# Patient Record
Sex: Female | Born: 1985 | Race: White | Hispanic: No | Marital: Married | Smoking: Former smoker
Health system: Southern US, Community
[De-identification: ages and names within clinical notes are randomized; demographics above are authoritative.]

## PROBLEM LIST (undated history)

## (undated) DIAGNOSIS — E079 Disorder of thyroid, unspecified: Secondary | ICD-10-CM

## (undated) DIAGNOSIS — N1330 Unspecified hydronephrosis: Secondary | ICD-10-CM

## (undated) DIAGNOSIS — N200 Calculus of kidney: Secondary | ICD-10-CM

## (undated) HISTORY — PX: TONSILLECTOMY: SUR1361

## (undated) HISTORY — PX: SEPTOPLASTY: SUR1290

## (undated) HISTORY — PX: NEPHROSTOMY: SHX1014

## (undated) HISTORY — PX: URETHERAL RE-IMPLANTATION: SHX2616

## (undated) HISTORY — PX: BREAST SURGERY: SHX581

## (undated) HISTORY — PX: FRACTURE SURGERY: SHX138

## (undated) HISTORY — PX: ABDOMINAL HYSTERECTOMY: SHX81

---
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
## 2003-07-06 ENCOUNTER — Other Ambulatory Visit: Admission: RE | Admit: 2003-07-06 | Discharge: 2003-07-06 | Payer: Self-pay | Admitting: Family Medicine

## 2004-05-09 ENCOUNTER — Other Ambulatory Visit: Admission: RE | Admit: 2004-05-09 | Discharge: 2004-05-09 | Payer: Self-pay | Admitting: Family Medicine

## 2004-05-25 ENCOUNTER — Ambulatory Visit (HOSPITAL_COMMUNITY): Admission: RE | Admit: 2004-05-25 | Discharge: 2004-05-25 | Payer: Self-pay | Admitting: Urology

## 2004-05-29 ENCOUNTER — Inpatient Hospital Stay (HOSPITAL_COMMUNITY): Admission: AD | Admit: 2004-05-29 | Discharge: 2004-05-29 | Payer: Self-pay | Admitting: Obstetrics & Gynecology

## 2004-05-30 ENCOUNTER — Inpatient Hospital Stay (HOSPITAL_COMMUNITY): Admission: AD | Admit: 2004-05-30 | Discharge: 2004-05-31 | Payer: Self-pay | Admitting: Obstetrics & Gynecology

## 2004-06-20 ENCOUNTER — Ambulatory Visit: Payer: Self-pay | Admitting: Obstetrics and Gynecology

## 2005-01-24 ENCOUNTER — Ambulatory Visit (HOSPITAL_COMMUNITY)
Admission: RE | Admit: 2005-01-24 | Discharge: 2005-01-24 | Payer: Self-pay | Admitting: Orthodontics and Dentofacial Orthopedics

## 2005-08-28 ENCOUNTER — Encounter (INDEPENDENT_AMBULATORY_CARE_PROVIDER_SITE_OTHER): Payer: Self-pay | Admitting: *Deleted

## 2005-08-28 ENCOUNTER — Ambulatory Visit (HOSPITAL_COMMUNITY): Admission: RE | Admit: 2005-08-28 | Discharge: 2005-08-28 | Payer: Self-pay | Admitting: Obstetrics and Gynecology

## 2006-05-09 ENCOUNTER — Inpatient Hospital Stay (HOSPITAL_COMMUNITY): Admission: RE | Admit: 2006-05-09 | Discharge: 2006-05-10 | Payer: Self-pay | Admitting: Obstetrics and Gynecology

## 2006-05-09 ENCOUNTER — Encounter (INDEPENDENT_AMBULATORY_CARE_PROVIDER_SITE_OTHER): Payer: Self-pay | Admitting: *Deleted

## 2006-05-26 ENCOUNTER — Inpatient Hospital Stay (HOSPITAL_COMMUNITY): Admission: AD | Admit: 2006-05-26 | Discharge: 2006-05-26 | Payer: Self-pay | Admitting: Obstetrics and Gynecology

## 2009-05-27 ENCOUNTER — Emergency Department (HOSPITAL_COMMUNITY): Admission: EM | Admit: 2009-05-27 | Discharge: 2009-05-28 | Payer: Self-pay | Admitting: Emergency Medicine

## 2009-11-20 ENCOUNTER — Emergency Department (HOSPITAL_COMMUNITY): Admission: EM | Admit: 2009-11-20 | Discharge: 2009-11-20 | Payer: Self-pay | Admitting: Emergency Medicine

## 2010-05-11 LAB — URINALYSIS, ROUTINE W REFLEX MICROSCOPIC
Leukocytes, UA: NEGATIVE
Nitrite: NEGATIVE
Specific Gravity, Urine: 1.027 (ref 1.005–1.030)
Urobilinogen, UA: 0.2 mg/dL (ref 0.0–1.0)

## 2010-05-11 LAB — URINE MICROSCOPIC-ADD ON

## 2010-05-11 LAB — URINE CULTURE: Culture  Setup Time: 201109260016

## 2010-05-11 LAB — POCT I-STAT, CHEM 8
HCT: 42 % (ref 36.0–46.0)
Hemoglobin: 14.3 g/dL (ref 12.0–15.0)
Potassium: 4 mEq/L (ref 3.5–5.1)
Sodium: 140 mEq/L (ref 135–145)
TCO2: 25 mmol/L (ref 0–100)

## 2010-05-17 LAB — URINALYSIS, ROUTINE W REFLEX MICROSCOPIC
Nitrite: POSITIVE — AB
Protein, ur: NEGATIVE mg/dL
Specific Gravity, Urine: 1.023 (ref 1.005–1.030)
Urobilinogen, UA: 1 mg/dL (ref 0.0–1.0)

## 2010-05-17 LAB — DIFFERENTIAL
Eosinophils Absolute: 0 10*3/uL (ref 0.0–0.7)
Eosinophils Relative: 0 % (ref 0–5)
Lymphocytes Relative: 9 % — ABNORMAL LOW (ref 12–46)
Lymphs Abs: 1.1 10*3/uL (ref 0.7–4.0)
Monocytes Absolute: 0.8 10*3/uL (ref 0.1–1.0)

## 2010-05-17 LAB — BASIC METABOLIC PANEL
BUN: 13 mg/dL (ref 6–23)
Chloride: 104 mEq/L (ref 96–112)
GFR calc non Af Amer: >60 mL/min (ref >60)
Glucose, Bld: 105 mg/dL — ABNORMAL HIGH (ref 70–99)
Potassium: 3.3 mEq/L — ABNORMAL LOW (ref 3.5–5.1)
Sodium: 137 mEq/L (ref 135–145)

## 2010-05-17 LAB — CBC
HCT: 39.8 % (ref 36.0–46.0)
Hemoglobin: 13.7 g/dL (ref 12.0–15.0)
MCV: 92.4 fL (ref 78.0–100.0)
Platelets: 210 10*3/uL (ref 150–400)
RDW: 12.7 % (ref 11.5–15.5)
WBC: 11.8 10*3/uL — ABNORMAL HIGH (ref 4.0–10.5)

## 2010-05-17 LAB — URINE MICROSCOPIC-ADD ON

## 2010-07-14 NOTE — Group Therapy Note (Signed)
NAME:  Savannah Reyes, Savannah Reyes NO.:  [id number]   MEDICAL RECORD NO.:  [id number]          PATIENT TYPE:  WOC   LOCATION:  [facility]                   FACILITY:  [facility]   PHYSICIAN:  Argentina Donovan, MD        DATE OF BIRTH:  May 08, 1985   DATE OF SERVICE:  06/20/2004                                    CLINIC NOTE   The patient is a 25 year old white female nulligravida who underwent  menarche fairly late at age 11 and who was fine until approximately six  months ago when she saw her primary care doctor and he put her on Synthroid.  That was done because her periods became irregular.  She became regular for  two months and then started having abnormal bleeding.  He tried her on  Lo/Ovral and Yasmin with the same result.  She had been placed on Synthroid  before for hypothyroidism, and then she said her tests were normal after she  had stopped it, so at the present time she is not on any.  Her problem is  now that she has been bleeding for the past two months, which she states is  heavy; however, she has a hematocrit of 35, so the bleeding is probably less  than her impression of it.  In any case on physical examination she weighs  176 pounds and is 5 foot 2, normal blood pressure at 127/73.  HEENT:  Significant by mild-to-moderate acne and upper lip hair growth, as well as  some chin growth that she shaves.  She is chunky in appearance and has a  female escutcheon, which she shaves.  External genitalia are normal, not an  enlarged clitoris, BUS within normal limits, vagina is clean and well  rugated, cervix clean and nulliparous, uterus and adnexae are normal.  The  patient had a recent ultrasound which was normal.  We discussed in detail  polycystic ovarian syndrome, as well as insulin insensitivity.  I told her  our plan with her is to place her on a strong birth control, i.e., Ovcon 50  to control the bleeding.  If that does not control the bleeding, then I  would do a D&C and  hysteroscopy.  If it does control it, then we may reduce  the hormone levels of the pills over the next few months until we find one  that works and yet is the lowest that she can take.  She is not on Synthroid  at this time, so we are going to get a TSH on her, also going to get a  hormone evaluation as well as a hemoglobin A1c.  Her father is a diabetic.   IMPRESSION:  Polycystic ovarian syndrome.  The Rx is for Ovcon 50.      PR/MEDQ  D:  06/20/2004  T:  06/20/2004  Job:  [id number]
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
## 2010-07-14 NOTE — Discharge Summary (Signed)
[place]. [facility]  Patient:    Savannah Reyes, Savannah Reyes                    MRN: [id number] Adm. Date:  [id number] Disc. Date: [id number] Attending:  Dominica Severin Dictator:   Ralene Bathe, P.A.                           Discharge Summary  ADMISSION DIAGNOSES: 1. Nonunion of right tibia, status post Rush rodding of right fibula. 2. History of renal calculi.  DISCHARGE DIAGNOSES: 1. Nonunion of right tibia, status post Rush rodding of right fibula. 2. History of renal calculi. 3. Status post removal of Rush rod and intermedullary nailing of right tibia.  OPERATIONS:  Intermedullary nail to right tibia, removal of Rush rod of right fibula, and ostectomy.  Surgeon:  Elisha Ponder, M.D.  Assistant:  Dorie Rank, P.A.  General anesthesia.  BRIEF HISTORY:  Savannah Reyes is a 25 year old white female who has been involved in our practice for some time following a right tib-fib fracture. She was treated with Rush rod to the right fibula and closed casting.  She has developed a nonunion.  Despite conservative measures, this has not improved and removal of Rush rod and IM nailing are indicated.  The risks and benefits were discussed with her and her mother.  At this time, she wishes to proceed.  HOSPITAL COURSE:  The patient was admitted and underwent the above-noted procedures, and tolerated this well.  All appropriate IV antibiotics and analgesics were provided.  Postoperatively, the patient was placed full weightbearing in therapy.  She had an extreme amount of pain.  PCA morphine was continued through postoperative day #2.  The Hemovac that was placed intraoperatively was removed without difficulty.  The patient remained afebrile.  She had some mild nausea and vomiting that resolved.  On May 23, 2000, she was afebrile, the vital signs were stable, she was voiding without difficulty, she not longer had nausea or vomiting, and she was tolerating  p.o. analgesics.  At that time, her dressings were changed and she was stable for discharge to home in the care of her mother.  All incisions were dry with no evidence of infection, no erythema, and the calf was soft.  LABORATORY VALUES:  Preoperative hemoglobin 3.5, postoperatively of 10.2 and 9.6.  Chemistries were normal on admission.  X-rays were not done.  CONDITION ON DISCHARGE:  Stable and improved.  DISPOSITION:  The patient is being discharged to home.  DISCHARGE MEDICATIONS:  Prescriptions were given for Percocet 5 mg, #40, one to two p.r.n. pain and Robaxin 500 mg one every eight hours p.r.n. spasm.  One aspirin b.i.d.  She is also on Colace for constipation.  Continue her over-the-counter calcium supplements.  Resume home medications.  SPECIAL INSTRUCTIONS:  She is to continue calf pumps and weightbearing as tolerated.  Crutches for comfort.  FOLLOW-UP:  Follow up in our office two weeks postoperatively.  Call for a time.  DIET:  Resume home diet. DD:  06/05/00 TD:  06/05/00 Job: [id number]

## 2010-07-14 NOTE — Op Note (Signed)
[place]. [facility]  Patient:    Savannah Reyes, Savannah Reyes                    MRN: [id number] Adm. Date:  [id number] Attending:  Dominica Severin                           Operative Report  DATE OF BIRTH:  Nov 03, 1985.  PREOPERATIVE DIAGNOSIS:  Status post Rush rodding, right fibula, with tibial nonunion.  POSTOPERATIVE DIAGNOSIS:  Status post Rush rodding, right fibula, with tibial nonunion.  PROCEDURES: 1. Removal of Rush rod, right fibula. 2. Fibular ostectomy 1 cm, approximately 13 cm above the distal fibula tip. 3. Reamed intramedullary tibial rod placement, right tibia, with additional    interlocking.  SURGEON:  Elisha Ponder, M.D.  ASSISTANT:  Dorie Rank, P.A.  COMPLICATIONS:  None.  ANESTHESIA:  General endotracheal.  ESTIMATED BLOOD LOSS:  Less than 200 cc.  DRAINS:  Two.  TOURNIQUET TIME:  Zero.  INDICATIONS FOR PROCEDURE:  This patient is a 25 year old white female who presents with the above-mentioned diagnosis.  I have counseled her and her parents in regard to the risks and benefits of surgery, including risk of infection, bleeding, damage to normal structures, and failure of surgery to accomplish the intended goals of relieving symptoms and restoring function.  I have discussed with them the risks of fat embolus, pulmonary embolus, and other postoperative complications.  With this in mind, they desire to proceed. All questions have been encouraged and answered preoperatively.  OPERATIVE FINDINGS:  The patient had tibial nonunion.  She underwent successful reaming and tibial rod placement.  This was a [employer] nail, titanium in nature, 8.5 mm wide and 30 cm long.  It was locked distally and left unlocked proximally.  Fibular ostectomy and Rush rod removal were accomplished without difficulty.  There were no complications.  DESCRIPTION OF PROCEDURE:  The patient was seen by myself and anesthesia, taken to the  operative suite, underwent smooth induction of general endotracheal anesthesia, was laid supine, appropriately padded, prepped and draped in the usual sterile fashion about the right lower extremity after tourniquet was applied.  Once this was done, the patient had the leg placed in a triangle, and the operation commenced with an incision about the anterior medial aspect of the knee proximal tibia region.  The patient had dissection carried through skin with a knife blade.  Following this, electrocautery was used for hemostasis, and deeper dissection was carried out.  I entered the anterior tibial cortex with an awl under radiographic guidance just underneath the patellar tendon.  Care was taken to avoid injury to the patellar tendon. This was in the bare spot and appropriately noted on x-ray.  Following awl placement under AP and lateral x-ray, the patient then had guidewire placed. The guidewire was difficult to advance with the ball tip over the nonunion site, and thus I enlarged the proximal tract and then placed a 5 mm hand-held reamer into the previously-made hole, attached it across the fracture site with a hand-held twisting action, and that was enough to allow for a ball-tip guidewire placement across the fracture site of the 2.0 mm variety ball-tip guidewire.  Once this was done, patient underwent sequential reaming up to a 9.5, taking care to take x-rays in the AP and lateral plane to ensure correct placement and position.  I was very happy with the reaming.  There was excellent chatter.  Following this, the patient was reamed appropriately in the proximal tibial region.  The tibia was then measured without difficulty. Following this, the tibial nail was placed without difficulty.  The tibial nail had no complications with its placement.  It was centered nice distally on AP and lateral and corrected her recurvatum which was previously present and was then locked distally with  perfect circle technique utilizing [employer] interlocking screws.  This was done through stab incisions in the usual standard fashion.  Once this was done, the patient had the Rush removed through a small 1 cm incision made distally.  Dissection was then accomplished at the midportion of the leg laterally where the fibula was isolated.  Dissection was carried through skin with a knife blade.  An interval between the anterior and lateral compartments was made, a fasciotomy was performed so as to prevent painful muscle herniation, and the patient then had dissection carried down to the lateral aspect of the fibula.  I made sure this was 13 cm above the tip of the fibula and then performed an ostectomy of a 1 cm piece of the fibula bone using an oscillating saw and osteotome.  This was done without difficulty.  The 1 cm piece of fibula was removed.  Following this, the wound was copiously irrigated and then closed in layered fashion with 2-0 Vicryl in the subcutaneous tissue, drain in the deeper tissue, and staple gun in the skin edge.  There was no excessive bleeding, and this looked excellent.  Once this was done, final x-rays were taken and saved for permanent documentation.  This showed excellent alignment in the AP and lateral planes, good nail position proximally, distally, and across the fracture site, and a good fibular ostectomy, which will allow impaction of the tibial nonunion site.  Thus the tibial nonunion site underwent reaming, which should serve as a bone graft, and intramedullary rod placement.  Will begin immediate weightbearing, as she is effectively dynamized by not locking her proximally.  Distal screws had excellent fixation, and she should be okay for weightbearing to tolerance.  I did stress-test the ankle and noted no significant instability after the fibular ostectomy.  The syndesmosis was competent.  The patient had an eighth-inch Hemovac drain placed  proximally in the wound, and this was closed in a layered fashion with 2-0 Vicryl in the deep tissue, 2-0 Vicryl in the subcutaneous tissue, and staple gun in the skin  edge.  The medial stab incisions for the interlocking screws and the distal fibular tip were closed with interrupted Prolene suture.  The patient had very soft compartments, excellent refill, and no immediate complications.  I placed a sterile compressive dressing and Ace wraps around the leg.  She was taken to the recovery room, extubated, in stable condition.  All sponge, needle, and instrument counts were reported as correct, and there were no immediate intraoperative complications.  She will be admitted for IV fluids, observation, IV antibiotics, pain management, etc.  All questions have been encouraged and answered.  It has been a pleasure to participate in her care, and I will look forward to participating in her postoperative recovery. DD:  05/20/00 TD:  05/21/00 Job: [id number]
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
## 2010-07-14 NOTE — Op Note (Signed)
[facility]  Patient:    Savannah Reyes, Savannah Reyes                    MRN: [id number] Proc. Date: 06/22/99 Adm. Date:  [id number] Attending:  Dominica Reyes                           Operative Report  DATE OF BIRTH:  September 17, 1950  PREOPERATIVE DIAGNOSIS: Right closed tibia-fibula fracture, distal third, with displacement.  POSTOPERATIVE DIAGNOSIS:  Right closed tibia-fibula fracture, distal third, with displacement.  OPERATION:  Rush rod intramedullary nailing of the right fibula to restore anatomical alignment and closed reduction of the right tibia followed by long leg cast placement after reduction.  SURGEON:  Aron Baba, M.D.  ASSISTANT:  Ralene Bathe, P.A.  ANESTHESIA:  General.  IMMEDIATE COMPLICATIONS:  None.  ESTIMATED BLOOD LOSS:  Minimal.  TOURNIQUET TIME:  0  INDICATIONS:  This patient is a very pleasant 24 year old white female who is skeletally mature and sustained the above-mentioned injury today.  This happened this morning.  She has not had any neurovascular problems, signs or symptoms of compartment syndrome, or other abnormalities in her course to date.  The patient and her family have been counselled in regards to IM nailing of the tibia and fibula and/or fixation as necessary to gain anatomical alignment and restore the integrity to her lower extremity.  I discussed the risks and benefits of surgery including risk of infection, bleeding, anesthesia, damage to normal structures, failure of surgery to attain intended goals of relieving symptoms and restoring function.  With this in mind, they desire to proceed.  I have discussed with them pulmonary embolus, fat embolus, malunion, nonunion, and other complications.  They understand and desire to proceed.  OPERATIVE FINDINGS:  This patient had a displaced tibia-fibula fracture.  Once the patient underwent Rush rod placement about the fibula with closed reduction of  the tibia, the anatomic restoration in the AP and lateral planes was satisfactory.  She had a good mediolateral stability without any varus or valgus angulation.  She appeared to lock in nicely.  She had some slight anteroposterior movement; however, this was acceptable, I felt, given her age, very small canal diameter in the tibia, and anatomic restoration.  I then proceeded with long leg casting.  Long leg casting was performed.  Hard copy x-rays were taken, and following this, deemed this view to the adequate.  I have discussed this with the family at length.  They were shown the x-rays intraoperatively as well as the intraoperative decision making.  They were pleased with the outcome of surgery.  I did discuss with them that the patient may have a risk for displacement, etc.  Should she displace, she may ultimately require IM nailing in the tibia.  However, at the present time, she appears fairly stable, and, given her small canal diameter, I do think she is very acceptable to treatment in this fashion.  DESCRIPTION OF PROCEDURE IN DETAIL:  The patient was brought to the operative suite after cancelling and holding by myself in anesthesia.  Once in the operative suite, she underwent a smooth induction of anesthesia.  Dr. Laverle Hobby supervised this.  Next, she was laid supine and appropriately padded and prepped and draped about the right lower extremity with Betadine scrub followed by DuraPrep prep.  Sterile drapes were placed.  Following this, the patient had the leg placed  in slight hand-held traction, and a small 1 cm incision was made over the fibula.  With this incision made, the patient then had dissection carried bluntly down to the fibula tip.  The fibula tip was entered with soft tissue protector around a drill.  The drill entered the distal tip of the fibula.  Soft tissue was protected.  Following this, the Rush rod awl was used for making a path in the distal fibula.   Following this, a 2.5 Rush rod was then placed in a distal-to-proximal fashion.  The tibia-fibula fracture was reduced, and the Rush rod was then placed across the fibula fracture site.  I was very happy with the reduction at this point.  It was checked under AP, lateral, and oblique x-rays, and there was a good mediolateral stability.  There was slight AP movement which was noted on x-ray (fluoroscopy).  With this noted, I then took a hard copy film.  This looked to be fairly acceptable.  Given her age, injury, and alignment, I did feel that she would be acceptable under treatment at this juncture in a long leg cast. Given the small canal diameter, I elected to forego IM nailing of the tibia and place her in a long leg cast.  I then placed her in a long leg cast padded to my satisfaction and wrapped with fiberglass.  This was done without difficulty.  X-rays were taken in the cast which showed adequate reduction in the AP plane, but the lateral plane also showed good reduction.  There was no angulation.  There was slight translation noted.  This was felt to be acceptable by myself.  Once this was viewed, I then examined the leg once again and overwrapped it with fiberglass.  Following this, the patient was extubated and taken to the recovery room in stable condition.  In the recovery room, she could move her toes and feel her toes.  There were no signs or symptoms of compartment syndrome.  She was nontender with passive dorsiflexion.  We will monitor her cast very closely tonight for any signs or symptoms of compartment syndrome, cast tightness, or excessive swelling, etc. Her compartments were soft pre and postoperatively.  There was no excessive soft tissue trauma noted.  The patient had a full discussion about the surgery with her family.  I showed them x-rays.  They were very pleased with her alignment and the decision making, etc.  We will monitor her closely in the hospital.  All  questions have been encouraged and answered. DD:  06/22/99 TD:  06/23/99 Job: [id number]

## 2010-07-14 NOTE — Op Note (Signed)
NAME:  Savannah Reyes, Savannah Reyes             ACCOUNT NO.:  [id number]   MEDICAL RECORD NO.:  [id number]          PATIENT TYPE:  AMB   LOCATION:  SDC                           FACILITY:  [facility]   PHYSICIAN:  Michelle L. Grewal, M.D.DATE OF BIRTH:  06-26-1985   DATE OF PROCEDURE:  05/09/2006  DATE OF DISCHARGE:                               OPERATIVE REPORT   PREOPERATIVE DIAGNOSES:  1. Dysfunctional uterine bleeding.  2. Pelvic pain.  3. Polycystic ovary.   POSTOPERATIVE DIAGNOSES:  1. Dysfunctional uterine bleeding.  2. Pelvic pain.  3. Polycystic ovary.   PROCEDURE:  Total abdominal hysterectomy and bilateral salpingo-  oophorectomy.   SURGEON:  Michelle L. Vincente Poli, M.D.   ASSISTANTFreddy Finner, M.D.   ANESTHESIA:  Spinal.   SPECIMENS:  Uterus, tubes, cervix and ovaries.   ESTIMATED BLOOD LOSS:  200 mL.   COMPLICATIONS:  None.   PROCEDURE:  Patient was taken to the operating room after informed  consent was obtained.  She is a 25 year old gravida 0 with known PCO;  she also has had several years of DUB and pelvic pain.  This patient has  been well-known to me.  We have tried multiple birth control pills,  Glucophage and other insulin sensitizers to control her symptomatology.  One year ago, I performed a D&C for her DUB.  Unfortunately, none of the  measures have worked.  She has had pelvic pain almost every day since  the surgery.  She also complained of bleeding almost every day since the  surgery as well.  The patient, over a year ago, had requested  hysterectomy and oophorectomy for relief of her symptoms; I, however,  was reluctant to because she has never conceived and because of her age.  Throughout this past time, she has requested multiple times and I even  had her come in with her mother for extensive counseling.  After  extensive counseling in regards to hysterectomy, I agreed to proceed  with the surgery.  She is well aware of the known and acceptable risks  of  surgery.  First off, she is aware that she will no longer have  fertility after the surgery.  She is also aware of the risks associated  with surgery such as anesthesia, risk of infection, bleeding, risk of  injury to internal organs, risk of return of pelvic pain, risk of  problems associated with postsurgical hormone therapy.  After she was  taken to the operating room, a spinal was placed by Dr. Harvest Forest.  She was  prepped and draped in the usual sterile fashion.  A Foley catheter was  inserted.  A low transverse incision was made and carried down to the  fascia.  The fascia was scored in the midline and extended laterally.  The rectus muscles were separated in the midline and the peritoneum was  entered bluntly.  The peritoneal incision was then stretched.  The self-  retaining retractor was inserted into the abdominal cavity.  Large and  small bowel were placed in the upper abdomen.  The uterus was elevated  using 2 Kelly clamp across the  triple pedicles.  Her ovaries were  bilaterally enlarged, consistent with polycystic ovaries.  Uterus itself  appeared to be normal.  There was no evidence of endometriosis.  She had  no adhesions.  Identifying the round ligament on the right side, we then  suture-ligated using 0 Vicryl suture and I then developed an avascular  window just beneath the infundibulopelvic ligament on the right side.  A  curved Heaney clamp was placed across the IP ligament with careful  attention to the ureter and was well below our clamps and it was suture-  ligated with 0 Vicryl suture and further secured using a free tie of 0  Vicryl suture.  This was done on the left side in a very similar  fashion.  We developed a bladder flap and skeletonized the uterine  arteries on either side and then the uterine arteries were clamped at  the level of the internal os using curved Heaney clamps.  Each pedicle  was suture-ligated using 0 Vicryl suture with careful attention that  the  bladder was well out of our way.  We walked our way down the cervix by  clamping, using straight Heaney clamps on the uterosacral cardinal  ligaments on either side.  Each pedicle was suture-ligated using 0  Vicryl suture.  We then clamped beneath the external os using a curved  Heaney clamp and the specimen was removed.  Angled stitches were placed  using 0 Vicryl suture and the remainder of the cuff was closed using a  figure-of-eight using an 0 Vicryl suture.  Irrigation was performed;  hemostasis was excellent.  All instruments and laparotomy pads were  removed from the abdominal cavity.  The peritoneum was closed using 0  Vicryl and the rectus muscles were reapproximated using the same 0  Vicryl.  The fascia was closed using 0 Vicryl in a running stitch,  starting at each corner and meeting in the midline.  After irrigation of  the subcutaneous layer, the skin was closed using 3-0 Vicryl in the  subcuticular and the skin was closed with Dermabond.  All sponge, lap  and instrument counts were correct x2.  The patient went to recovery  room in stable condition.      Michelle L. Vincente Poli, M.D.  Electronically Signed     MLG/MEDQ  D:  05/09/2006  T:  05/09/2006  Job:  [id number]

## 2010-07-14 NOTE — Op Note (Signed)
NAME:  Savannah Reyes, Savannah Reyes             ACCOUNT NO.:  [id number]   MEDICAL RECORD NO.:  [id number]          PATIENT TYPE:  AMB   LOCATION:  SDC                           FACILITY:  [facility]   PHYSICIAN:  Michelle L. Grewal, M.D.DATE OF BIRTH:  [birth year]   DATE OF PROCEDURE:  08/28/2005  DATE OF DISCHARGE:                                 OPERATIVE REPORT   PREOPERATIVE DIAGNOSIS:  Dysfunctional uterine bleeding.   POSTOPERATIVE DIAGNOSIS:  Dysfunctional uterine bleeding.   PROCEDURE:  Dilatation and curettage.   SURGEON:  Michelle L. Vincente Poli, M.D.   ANESTHESIA:  MAC with local.   SPECIMENS:  Uterine curettings.   ESTIMATED BLOOD LOSS:  Minimal.   COMPLICATIONS:  None.   PROCEDURE:  Patient was taken to the operating room, where she was given  sedation and placed in the lithotomy position.  She was prepped and draped  in the usual sterile fashion.  In-and-out catheter was used to empty the  bladder.  A speculum was inserted into the vagina.  The cervix was grasped  with a tenaculum and the cervical internal os was gently dilated after a  paracervical block was performed.  The sharp curette was inserted and the  uterus was thoroughly curetted of all tissue.  There was a moderate amount  of tissue removed.  Polyp forceps were then inserted and the uterus was then  further cleaned out.  A final sharp curettage was then performed.  All  tissue was sent to Pathology for analysis.  There was minimal bleeding  noted.  All sponge, lap and instrument counts were correct x2.  All  instruments were removed from the vagina.  The patient went to recovery room  in stable condition.      Michelle L. Vincente Poli, M.D.  Electronically Signed     MLG/MEDQ  D:  08/28/2005  T:  08/28/2005  Job:  [id number]

## 2012-04-09 IMAGING — CT CT ABD-PELV W/O CM
2 of 4 series · 17 of 46 positions shown, 19 images · non-contrast
Comparison: Prior examination 05/28/2009.

CLINICAL DATA: Right flank pain and hematuria.  Known left kidney
stone.

CT ABDOMEN AND PELVIS WITHOUT CONTRAST
TECHNIQUE: Multidetector CT imaging of the abdomen and pelvis was
performed following the standard protocol without intravenous
contrast.

[Series 2: stone under 200# w/ prev · axial · 0.80mm/px · z∈[-845,-480]mm · 14 of 79 slices shown, 16 images]
[im 3/79  soft-tissue]
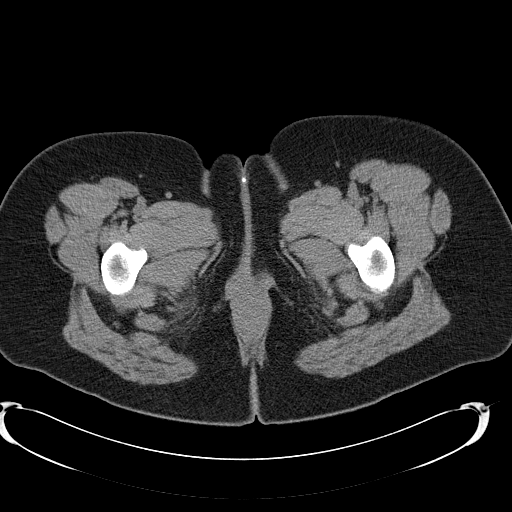
[im 3/79  bone]
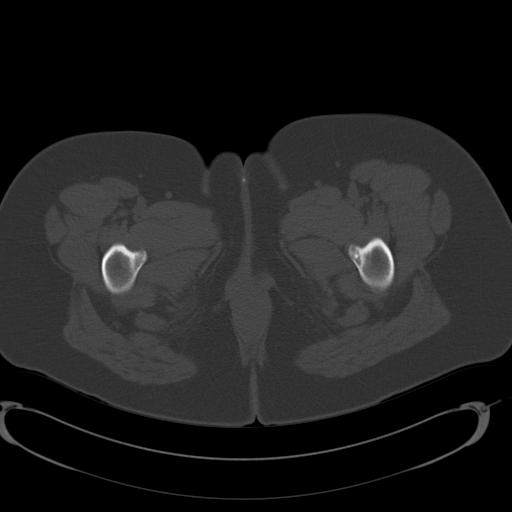
[im 9/79  soft-tissue]
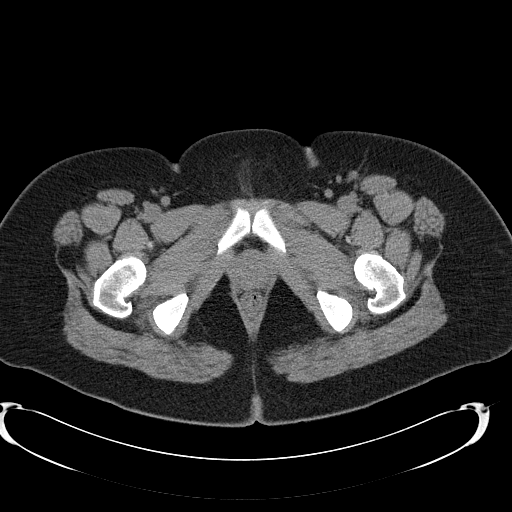
[im 14/79  soft-tissue]
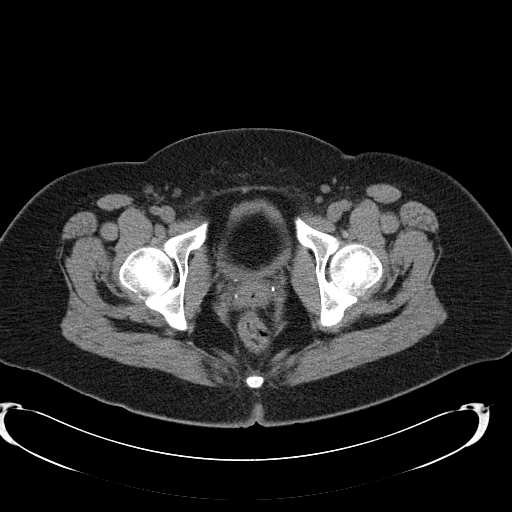
[im 20/79  soft-tissue]
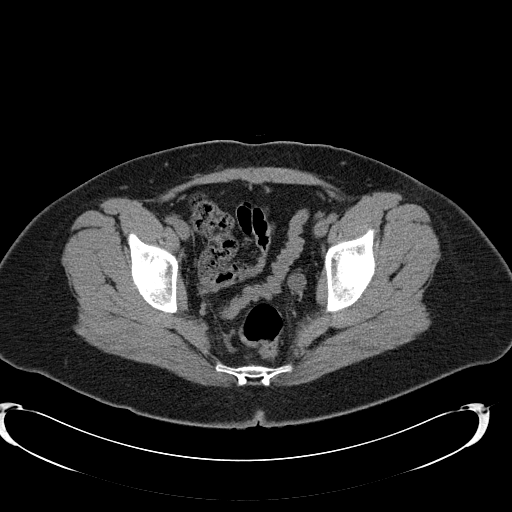
[im 26/79  soft-tissue]
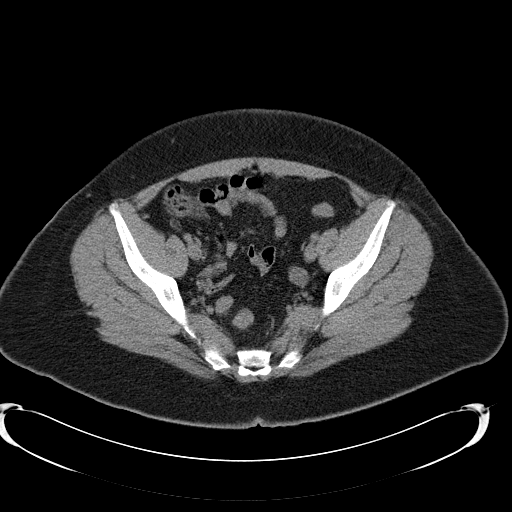
[im 31/79  soft-tissue]
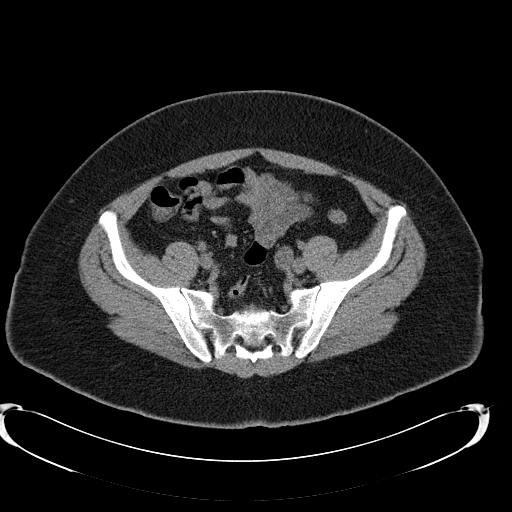
[im 37/79  soft-tissue]
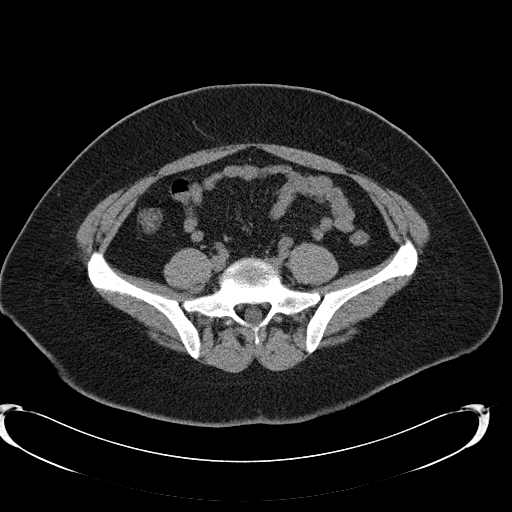
[im 42/79  soft-tissue]
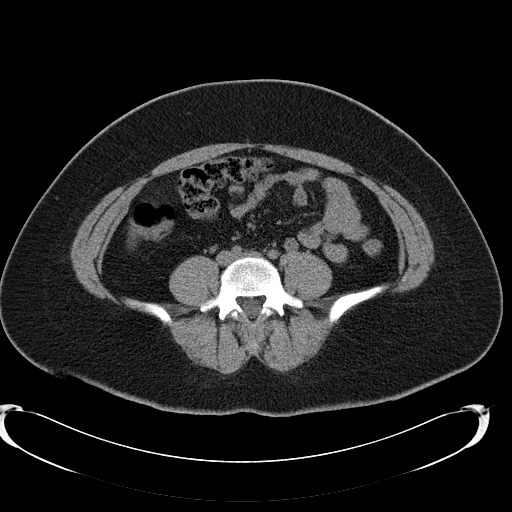
[im 48/79  soft-tissue]
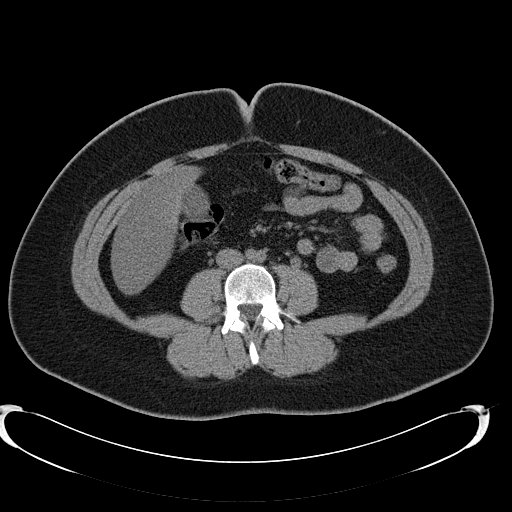
[im 48/79  bone]
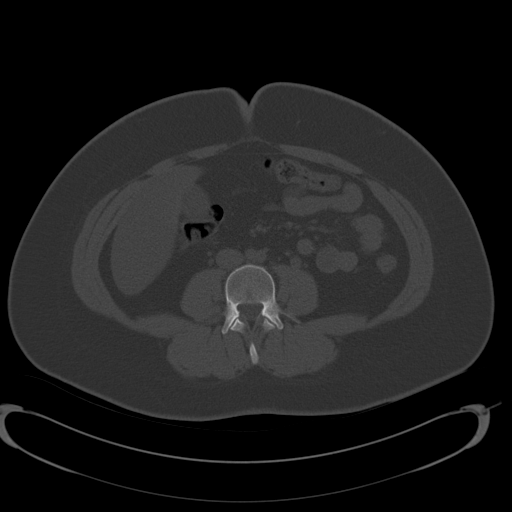
[im 53/79  soft-tissue]
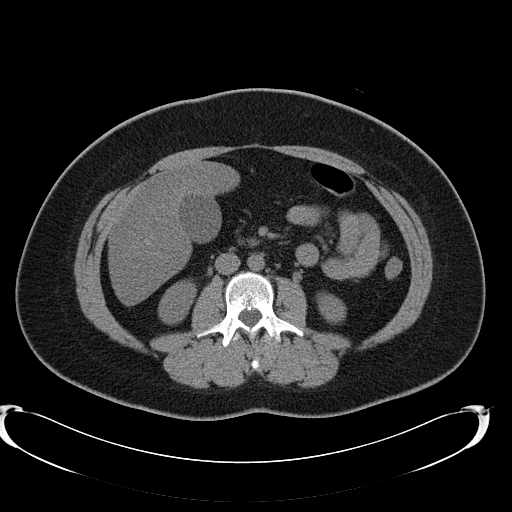
[im 59/79  soft-tissue]
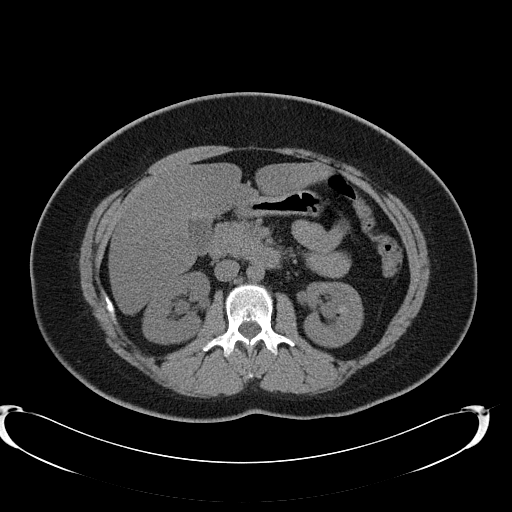
[im 65/79  soft-tissue]
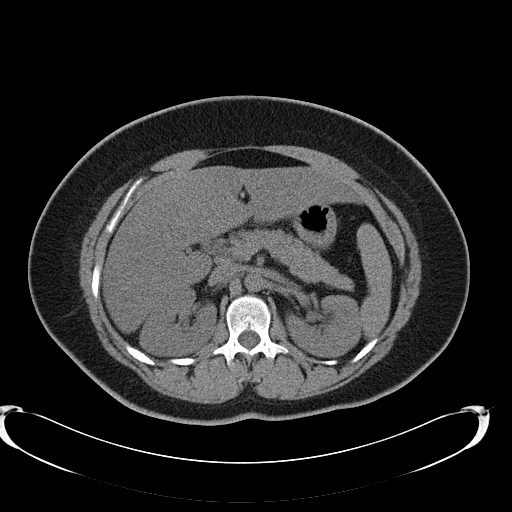
[im 70/79  soft-tissue]
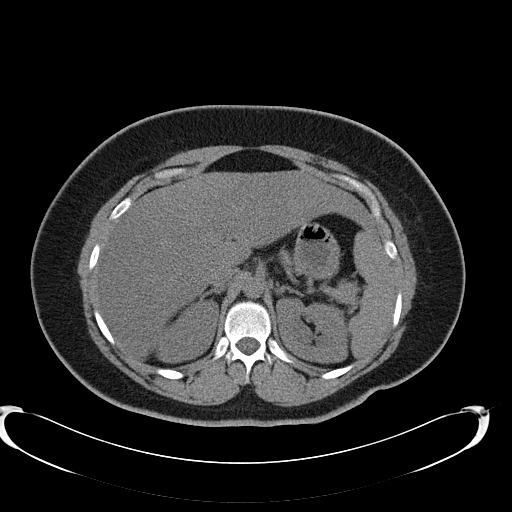
[im 76/79  soft-tissue]
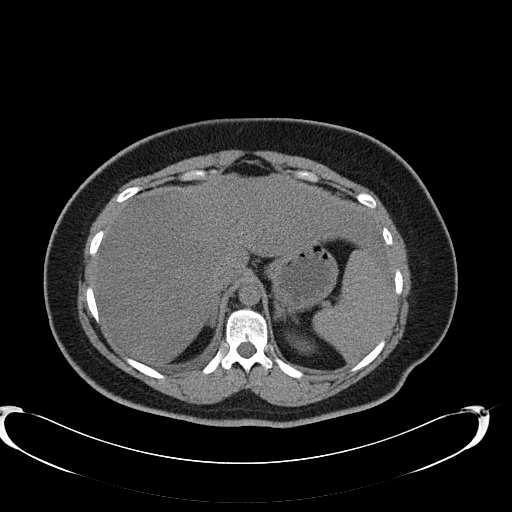

[Series 602: coronal · coronal · 0.80mm/px · 3 of 72 slices shown]
[im 24/72  soft-tissue]
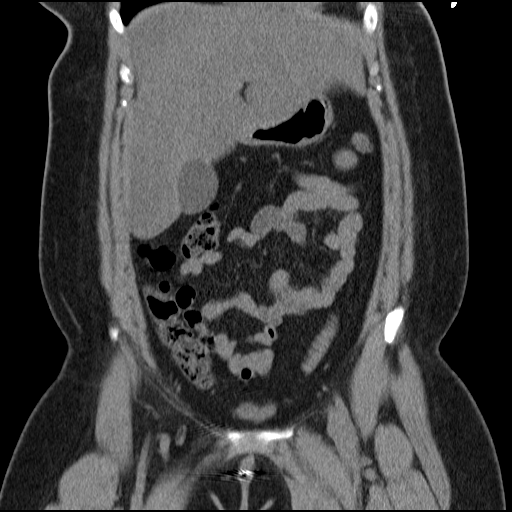
[im 32/72  soft-tissue]
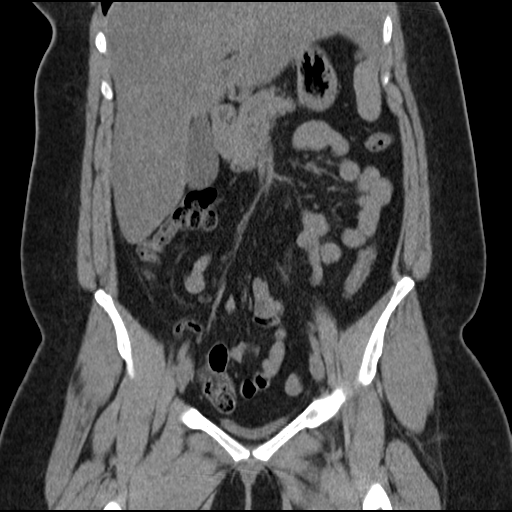
[im 40/72  soft-tissue]
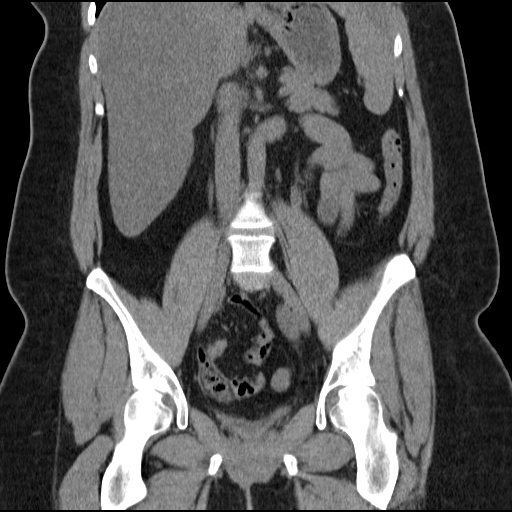

[17 of 46 positions shown; findings below may reference images not displayed]

FINDINGS: Previously demonstrated small right renal calculus has
passed into the ureter and is now positioned at the ureteral
vesicle junction (image 65).  This measures approximately 2 mm in
diameter and results in mild dilatation of the right ureter.  There
is no significant perinephric soft tissue stranding.  The
previously demonstrated dilatation of the left collecting system
has improved.  However, there is chronic left ureteral dilatation.
Calculi in the left lower pole calices have decreased in size,
measuring 12 mm on image 25.  No left ureteral calculi are
demonstrated.  There is no evidence of bladder calculus.

Severe hepatic steatosis is again noted.  The visualized spleen,
pancreas, gallbladder and adrenal glands appear normal.  The bowel
gas pattern is normal.  The appendix appears normal.  Uterus is
surgically absent.  There are bilateral L5 pars defects.
IMPRESSION: 1.  New obstruction of the distal right ureter by a 2 mm calculus
at the ureteral vesicle junction.
2.  Chronic left hydroureter, improved from prior study and without
associated perinephric soft tissue stranding or left ureteral
calculus.
3.  Decreased size of left renal calculi.
4.  Bilateral L5 pars defects and hepatic steatosis.

## 2015-11-15 ENCOUNTER — Emergency Department
Admission: EM | Admit: 2015-11-15 | Discharge: 2015-11-15 | Disposition: A | Discharge status: Home / Self Care | Payer: 59 | Source: Home / Self Care | Attending: Family Medicine | Admitting: Family Medicine

## 2015-11-15 ENCOUNTER — Encounter: Payer: Self-pay | Admitting: *Deleted

## 2015-11-15 DIAGNOSIS — R300 Dysuria: Secondary | ICD-10-CM

## 2015-11-15 DIAGNOSIS — R350 Frequency of micturition: Secondary | ICD-10-CM | POA: Diagnosis not present

## 2015-11-15 DIAGNOSIS — R319 Hematuria, unspecified: Secondary | ICD-10-CM

## 2015-11-15 DIAGNOSIS — R109 Unspecified abdominal pain: Secondary | ICD-10-CM

## 2015-11-15 HISTORY — DX: Unspecified hydronephrosis: N13.30

## 2015-11-15 HISTORY — DX: Disorder of thyroid, unspecified: E07.9

## 2015-11-15 HISTORY — DX: Calculus of kidney: N20.0

## 2015-11-15 LAB — POCT URINALYSIS DIP (MANUAL ENTRY)
Bilirubin, UA: NEGATIVE
Glucose, UA: NEGATIVE
Ketones, POC UA: NEGATIVE
Nitrite, UA: NEGATIVE
Protein Ur, POC: NEGATIVE
Spec Grav, UA: 1.02 (ref 1.005–1.03)
Urobilinogen, UA: 0.2 (ref 0–1)
pH, UA: 6 (ref 5–8)

## 2015-11-15 MED ORDER — CEPHALEXIN 500 MG PO CAPS: 500.0000 mg | ORAL_CAPSULE | Freq: Four times a day (QID) | ORAL | 0 refills | Status: DC

## 2015-11-15 MED ORDER — NAPROXEN 500 MG PO TABS: 500.0000 mg | ORAL_TABLET | Freq: Two times a day (BID) | ORAL | 0 refills | Status: AC

## 2015-11-15 NOTE — ED Triage Notes (Signed)
Pt c/o RT flank pain, hematuria, and frequent urination x today. Denies fever. Reports a hx of UTI and renal calculi. No OTC meds today. Denies fever.

## 2015-11-15 NOTE — ED Provider Notes (Signed)
CSN: [id number]     Arrival date & time 11/15/15  1237 History   First MD Initiated Contact with Patient 11/15/15 1257     Chief Complaint  Patient presents with  . Urinary Frequency   (Consider location/radiation/quality/duration/timing/severity/associated sxs/prior Treatment) HPI Savannah Reyes is a 30 y.o. female presenting to UC with c/o Right flank pain with associated urinary frequency, hematuria, and mild discomfort with urination that started today.  Hx of kidney stones and UTIs, both occurred several years ago. Pain in flank is aching and sore, 4/10 at this time.  Denies fever, chills, n/v/d. She has not tried any OTC medications PTA.    Past Medical History:  Diagnosis Date  . Hydronephrosis   . Renal calculus or stone   . Thyroid disease    Past Surgical History:  Procedure Laterality Date  . ABDOMINAL HYSTERECTOMY    . BREAST SURGERY    . FRACTURE SURGERY    . NEPHROSTOMY    . SEPTOPLASTY    . TONSILLECTOMY    . URETHERAL RE-IMPLANTATION     Family History  Problem Relation Age of Onset  . Diabetes Father   . Hypertension Father    Social History  Substance Use Topics  . Smoking status: Former Smoker    Quit date: 11/15/2011  . Smokeless tobacco: Never Used  . Alcohol use No   OB History    No data available     Review of Systems  Constitutional: Negative for chills and fever.  Gastrointestinal: Negative for abdominal pain, diarrhea, nausea and vomiting.  Genitourinary: Positive for dysuria, flank pain ( Right), frequency, hematuria and urgency. Negative for pelvic pain, vaginal bleeding, vaginal discharge and vaginal pain.  Musculoskeletal: Positive for back pain. Negative for myalgias.    Allergies  Allegra-d [fexofenadine-pseudoephed er]; Erythromycin; and Tequin [gatifloxacin]  Home Medications   Prior to Admission medications   Medication Sig Start Date End Date Taking? Authorizing Provider  estradiol (ESTRACE) 1 MG tablet Take 1 mg by mouth  daily.   Yes Historical Provider, MD  levothyroxine (SYNTHROID, LEVOTHROID) 150 MCG tablet Take 150 mcg by mouth daily before breakfast.   Yes Historical Provider, MD  cephALEXin (KEFLEX) 500 MG capsule Take 1 capsule (500 mg total) by mouth 4 (four) times daily. For 7 days 11/15/15   Junius Finner, PA-C  naproxen (NAPROSYN) 500 MG tablet Take 1 tablet (500 mg total) by mouth 2 (two) times daily. 11/15/15   Junius Finner, PA-C   Meds Ordered and Administered this Visit  Medications - No data to display  BP 134/89 (BP Location: Left Arm)   Pulse 79   Temp 98.2 F (36.8 C) (Oral)   Resp 16   Ht 5\' 2"  (1.575 m)   Wt 231 lb (104.8 kg)   SpO2 98%   BMI 42.25 kg/m  No data found.   Physical Exam  Constitutional: She is oriented to person, place, and time. She appears well-developed and well-nourished. No distress.  HENT:  Head: Normocephalic and atraumatic.  Mouth/Throat: Oropharynx is clear and moist.  Eyes: EOM are normal.  Neck: Normal range of motion.  Cardiovascular: Normal rate and regular rhythm.   Pulmonary/Chest: Effort normal.  Abdominal: Soft. She exhibits no distension and no mass. There is tenderness ( mild, suprapubic). There is CVA tenderness ( Right). There is no rebound and no guarding.  Musculoskeletal: Normal range of motion.  Neurological: She is alert and oriented to person, place, and time.  Skin: Skin is warm and  dry. She is not diaphoretic.  Psychiatric: She has a normal mood and affect. Her behavior is normal.  Nursing note and vitals reviewed.   Urgent Care Course   Clinical Course    Procedures (including critical care time)  Labs Review Labs Reviewed  POCT URINALYSIS DIP (MANUAL ENTRY) - Abnormal; Notable for the following:       Result Value   Clarity, UA cloudy (*)    Blood, UA moderate (*)    Leukocytes, UA small (1+) (*)    All other components within normal limits  URINE CULTURE    Imaging Review No results found.   MDM   1.  Urinary frequency   2. Dysuria   3. Hematuria   4. Right flank pain    Pt with hx of UTIs and kidney stones presenting to UC with sudden onset Right flank pain and urinary symptoms that started today.  UA: questionable mild UTI vs hematuria from renal stone, will send culture  Rx: Keflex and Naproxen Home care instructions provided. Encouraged good hydration. F/u with PCP in 1 week if not improving, sooner if worsening. Will hold off on imaging for stone at this time. Pt agreeable.    Junius Finnerrin O'Malley, PA-C 11/15/15 1330

## 2015-11-16 LAB — URINE CULTURE: Organism ID, Bacteria: NO GROWTH

## 2015-11-17 ENCOUNTER — Telehealth: Payer: Self-pay | Admitting: Emergency Medicine

## 2015-11-17 NOTE — Telephone Encounter (Signed)
Patient states doing better; went over negative urine culture results.

## 2016-03-21 ENCOUNTER — Ambulatory Visit
Admission: RE | Admit: 2016-03-21 | Discharge: 2016-03-21 | Disposition: A | Discharge status: Home / Self Care | Payer: 59 | Source: Ambulatory Visit | Attending: Chiropractic Medicine | Admitting: Chiropractic Medicine

## 2016-03-21 ENCOUNTER — Other Ambulatory Visit: Payer: Self-pay | Admitting: Chiropractic Medicine

## 2016-03-21 ENCOUNTER — Other Ambulatory Visit: Payer: 59

## 2016-03-21 DIAGNOSIS — M545 Low back pain: Principal | ICD-10-CM

## 2016-03-21 DIAGNOSIS — R202 Paresthesia of skin: Secondary | ICD-10-CM

## 2016-03-21 DIAGNOSIS — G8929 Other chronic pain: Secondary | ICD-10-CM

## 2016-03-21 DIAGNOSIS — R200 Anesthesia of skin: Secondary | ICD-10-CM

## 2018-07-05 ENCOUNTER — Emergency Department (HOSPITAL_COMMUNITY)
Admission: EM | Admit: 2018-07-05 | Discharge: 2018-07-06 | Disposition: A | Discharge status: Home / Self Care | Payer: 59 | Attending: Emergency Medicine | Admitting: Emergency Medicine

## 2018-07-05 ENCOUNTER — Encounter (HOSPITAL_COMMUNITY): Payer: Self-pay | Admitting: Emergency Medicine

## 2018-07-05 ENCOUNTER — Ambulatory Visit (HOSPITAL_COMMUNITY)
Admission: EM | Admit: 2018-07-05 | Discharge: 2018-07-05 | Disposition: A | Discharge status: Home / Self Care | Payer: 59 | Source: Home / Self Care | Attending: Family Medicine | Admitting: Family Medicine

## 2018-07-05 ENCOUNTER — Other Ambulatory Visit: Payer: Self-pay

## 2018-07-05 DIAGNOSIS — Z87891 Personal history of nicotine dependence: Secondary | ICD-10-CM | POA: Insufficient documentation

## 2018-07-05 DIAGNOSIS — L03211 Cellulitis of face: Secondary | ICD-10-CM

## 2018-07-05 DIAGNOSIS — R600 Localized edema: Secondary | ICD-10-CM | POA: Diagnosis present

## 2018-07-05 DIAGNOSIS — Z79899 Other long term (current) drug therapy: Secondary | ICD-10-CM | POA: Insufficient documentation

## 2018-07-05 DIAGNOSIS — K130 Diseases of lips: Secondary | ICD-10-CM

## 2018-07-05 MED ORDER — ACYCLOVIR 400 MG PO TABS: ORAL_TABLET | ORAL | 0 refills | Status: AC

## 2018-07-05 MED ORDER — ACYCLOVIR 200 MG PO CAPS
400.0000 mg | ORAL_CAPSULE | Freq: Once | ORAL | Status: AC
  Administered 2018-07-06: 400 mg via ORAL
  Filled 2018-07-05: qty 2

## 2018-07-05 MED ORDER — MICONAZOLE NITRATE 2 % EX CREA: 1.0000 "application " | TOPICAL_CREAM | Freq: Two times a day (BID) | CUTANEOUS | 0 refills | Status: AC

## 2018-07-05 MED ORDER — CLINDAMYCIN PHOSPHATE 600 MG/50ML IV SOLN
600.0000 mg | Freq: Once | INTRAVENOUS | Status: AC
  Administered 2018-07-05: 600 mg via INTRAVENOUS
  Filled 2018-07-05: qty 50

## 2018-07-05 MED ORDER — CLINDAMYCIN HCL 150 MG PO CAPS: 300.0000 mg | ORAL_CAPSULE | Freq: Four times a day (QID) | ORAL | 0 refills | Status: AC

## 2018-07-05 MED ORDER — CEPHALEXIN 500 MG PO CAPS: 500.0000 mg | ORAL_CAPSULE | Freq: Four times a day (QID) | ORAL | 0 refills | Status: AC

## 2018-07-05 NOTE — ED Provider Notes (Signed)
[facility] EMERGENCY DEPARTMENT Provider Note   CSN: [id number] Arrival date & time: 07/05/18  2046    History   Chief Complaint Chief Complaint  Patient presents with  . Abscess    HPI MARAJADE LEI is a 33 y.o. female.     The history is provided by the patient and medical records. No language interpreter was used.  Abscess   ELEASHA CATALDO is a 33 y.o. female  with a PMH as listed below who presents to the Emergency Department complaining of left lower jaw/face swelling which began yesterday.  Initially, she thought she may have had a pimple.  She tried to squeeze the area and no drainage came out.  It became more more painful.  This morning, it is swollen, located not.  She applied warm compresses with no improvement.  She then went to the urgent care.  Urgent care diagnosed her with facial cellulitis and started her on Keflex.  She has taken 2 doses total.  She feels as if her symptoms are now getting worse.  It is both more painful, and more swollen.  Still no drainage.  Given the worsening of her symptoms despite antibiotics, she came to the emergency department for further evaluation.   Past Medical History:  Diagnosis Date  . Hydronephrosis   . Renal calculus or stone   . Thyroid disease     There are no active problems to display for this patient.   Past Surgical History:  Procedure Laterality Date  . ABDOMINAL HYSTERECTOMY    . BREAST SURGERY    . FRACTURE SURGERY    . NEPHROSTOMY    . SEPTOPLASTY    . TONSILLECTOMY    . URETHERAL RE-IMPLANTATION       OB History   No obstetric history on file.      Home Medications    Prior to Admission medications   Medication Sig Start Date End Date Taking? Authorizing Provider  cephALEXin (KEFLEX) 500 MG capsule Take 1 capsule (500 mg total) by mouth 4 (four) times daily. 07/05/18  Yes Yu, Amy V, PA-C  escitalopram (LEXAPRO) 10 MG tablet Take 10 mg by mouth at bedtime.  04/28/18  Yes [provider]  estradiol (ESTRACE) 1 MG tablet Take 1 mg by mouth at bedtime.    Yes [provider]  levothyroxine (SYNTHROID, LEVOTHROID) 150 MCG tablet Take 150 mcg by mouth at bedtime.    Yes [provider]  naproxen sodium (ALEVE) 220 MG tablet Take 880 mg by mouth 2 (two) times daily as needed (pain).   Yes [provider]  miconazole (MICATIN) 2 % cream Apply 1 application topically 2 (two) times daily. Patient taking differently: Apply 1 application topically 2 (two) times daily. Apply to sore on lip 07/05/18   Cathie Hoops, Amy V, PA-C  naproxen (NAPROSYN) 500 MG tablet Take 1 tablet (500 mg total) by mouth 2 (two) times daily. Patient not taking: Reported on 07/05/2018 11/15/15   Rolla Plate    Family History Family History  Problem Relation Age of Onset  . Diabetes Father   . Hypertension Father     Social History Social History   Tobacco Use  . Smoking status: Former Smoker    Last attempt to quit: 11/15/2011    Years since quitting: 6.6  . Smokeless tobacco: Never Used  Substance Use Topics  . Alcohol use: No  . Drug use: No     Allergies  Allegra-d [fexofenadine-pseudoephed er]; Erythromycin; and Tequin [gatifloxacin]   Review of Systems Review of Systems  HENT: Positive for facial swelling.   Skin: Positive for wound.  All other systems reviewed and are negative.    Physical Exam Updated Vital Signs BP 119/69   Pulse 81   Temp 98.5 F (36.9 C) (Oral)   Resp 16   Ht 5' (1.524 m)   Wt 99.8 kg   SpO2 100%   BMI 42.97 kg/m   Physical Exam Vitals signs and nursing note reviewed.  Constitutional:      General: She is not in acute distress.    Appearance: She is well-developed.  HENT:     Head: Normocephalic and atraumatic.      Mouth/Throat:     Comments: OP clear.  No tenderness to the dentition.  Neck:     Musculoskeletal: Neck supple.  Cardiovascular:     Rate and Rhythm: Normal rate and regular rhythm.     Heart  sounds: Normal heart sounds. No murmur.  Pulmonary:     Effort: Pulmonary effort is normal. No respiratory distress.     Breath sounds: Normal breath sounds.  Skin:    General: Skin is warm and dry.  Neurological:     Mental Status: She is alert and oriented to person, place, and time.      ED Treatments / Results  Labs (all labs ordered are listed, but only abnormal results are displayed) Labs Reviewed  HSV CULTURE AND TYPING    EKG None  Radiology No results found.  Procedures Procedures (including critical care time)  Medications Ordered in ED Medications  clindamycin (CLEOCIN) IVPB 600 mg (600 mg Intravenous New Bag/Given 07/05/18 2258)     Initial Impression / Assessment and Plan / ED Course  I have reviewed the triage vital signs and the nursing notes.  Pertinent labs & imaging results that were available during my care of the patient were reviewed by me and considered in my medical decision making (see chart for details).       MERIAL AFTAB is a 33 y.o. female who presents to ED for worsening facial swelling to the left cheek.  Started yesterday.  She was seen by urgent care this morning and started on Keflex for presumed facial cellulitis.  She has taken 2 doses and feels as if her symptoms are getting worse instead of better.  On my examination today, she is afebrile, hemodynamically stable with a 2 x 2 tender area of induration to the left cheek.  There is no fluctuance.  Bedside ultrasound showed no pocket of pus such as abscess, therefore do not feel I&D would be of much benefit.  There is no tenderness to the dentition or signs of dental abscess.  She does have area around her lip consistent with cold sore, so possible this could be an HSV infection.  HSV culture from the area sent.  We will go ahead and start on acyclovir.  Given a dose of clindamycin in the emergency department through IV and will switch her antibiotics to Clinda given worsening despite  Keflex.  Hopeful this will start to improve in the next few days, but did speak at length about strict return precautions.  She understands this.  PCP follow-up encouraged.  All questions answered.  Patient seen by and discussed with Dr. Donnald Garre who agrees with treatment plan.   Final Clinical Impressions(s) / ED Diagnoses   Final diagnoses:  None    ED Discharge  Orders    None       Joleigh Mineau, Chase Picket, PA-C 07/05/18 2358    Arby Barrette, MD 07/06/18 2351

## 2018-07-05 NOTE — ED Triage Notes (Signed)
Pt has an abscess to left cheek on her face.  Continues to get worse despite antibiotics.

## 2018-07-05 NOTE — ED Provider Notes (Signed)
[facility]    CSN: [id number] Arrival date & time: 07/05/18  1023     History   Chief Complaint Chief Complaint  Patient presents with  . Facial Swelling    HPI Savannah Reyes is a 33 y.o. female.   33 year old female comes in for few day history of left cheek redness, irritation and 1 day history of swelling.  States first felt the itching, and thought it was a bug bite.  Then had a red spot, and thought it may have been acne.  She did some warm compress, and tried to squeeze the area.  She woke up this morning with increased swelling to the area.  She has mild pain, denies spreading erythema, warmth.  Denies fever, chills, night sweats.  Denies dental pain, swelling of the throat, trouble swallowing, trouble breathing.     Past Medical History:  Diagnosis Date  . Hydronephrosis   . Renal calculus or stone   . Thyroid disease     There are no active problems to display for this patient.   Past Surgical History:  Procedure Laterality Date  . ABDOMINAL HYSTERECTOMY    . BREAST SURGERY    . FRACTURE SURGERY    . NEPHROSTOMY    . SEPTOPLASTY    . TONSILLECTOMY    . URETHERAL RE-IMPLANTATION      OB History   No obstetric history on file.      Home Medications    Prior to Admission medications   Medication Sig Start Date End Date Taking? Authorizing Provider  escitalopram (LEXAPRO) 10 MG tablet Take by mouth. 04/28/18  Yes [provider]  cephALEXin (KEFLEX) 500 MG capsule Take 1 capsule (500 mg total) by mouth 4 (four) times daily. 07/05/18   Cathie Hoops, Eliska Hamil V, PA-C  estradiol (ESTRACE) 1 MG tablet Take 1 mg by mouth daily.    [provider]  levothyroxine (SYNTHROID, LEVOTHROID) 150 MCG tablet Take 150 mcg by mouth daily before breakfast.    [provider]  miconazole (MICATIN) 2 % cream Apply 1 application topically 2 (two) times daily. 07/05/18   Cathie Hoops, Zabria Liss V, PA-C  naproxen (NAPROSYN) 500 MG tablet Take 1 tablet (500 mg total) by  mouth 2 (two) times daily. 11/15/15   Lurene Shadow, PA-C    Family History Family History  Problem Relation Age of Onset  . Diabetes Father   . Hypertension Father     Social History Social History   Tobacco Use  . Smoking status: Former Smoker    Last attempt to quit: 11/15/2011    Years since quitting: 6.6  . Smokeless tobacco: Never Used  Substance Use Topics  . Alcohol use: No  . Drug use: No     Allergies   Allegra-d [fexofenadine-pseudoephed er]; Erythromycin; and Tequin [gatifloxacin]   Review of Systems Review of Systems  Reason unable to perform ROS: See HPI as above.     Physical Exam Triage Vital Signs ED Triage Vitals  Enc Vitals Group     BP 07/05/18 1036 106/72     Pulse Rate 07/05/18 1036 82     Resp 07/05/18 1036 18     Temp 07/05/18 1036 98.4 F (36.9 C)     Temp src --      SpO2 07/05/18 1036 97 %     Weight --      Height --      Head Circumference --      Peak Flow --  Pain Score 07/05/18 1037 5     Pain Loc --      Pain Edu? --      Excl. in GC? --    No data found.  Updated Vital Signs BP 106/72   Pulse 82   Temp 98.4 F (36.9 C)   Resp 18   SpO2 97%   Physical Exam Constitutional:      General: She is not in acute distress.    Appearance: She is well-developed. She is not diaphoretic.  HENT:     Head: Normocephalic and atraumatic.      Comments: Left corner of mouth with erythema and skin peeling. No pain on palpation.     Mouth/Throat:     Mouth: Mucous membranes are moist.     Pharynx: Oropharynx is clear. Uvula midline.  Eyes:     Conjunctiva/sclera: Conjunctivae normal.     Pupils: Pupils are equal, round, and reactive to light.  Neurological:     Mental Status: She is alert and oriented to person, place, and time.      UC Treatments / Results  Labs (all labs ordered are listed, but only abnormal results are displayed) Labs Reviewed - No data to display  EKG None  Radiology No results found.   Procedures Procedures (including critical care time)  Medications Ordered in UC Medications - No data to display  Initial Impression / Assessment and Plan / UC Course  I have reviewed the triage vital signs and the nursing notes.  Pertinent labs & imaging results that were available during my care of the patient were reviewed by me and considered in my medical decision making (see chart for details).    Given duration, will cover for facial cellulitis with Keflex.  No obvious signs of abscess at this time, will have patient continue warm compress and monitor.  Avoid picking/squeezing the area.  Return precautions given.  Patient with possible angular cheilitis on exam.  Will start miconazole as directed.  Return precautions given.  Patient expresses understanding and agrees to plan.  Final Clinical Impressions(s) / UC Diagnoses   Final diagnoses:  Facial cellulitis  Angular cheilitis    ED Prescriptions    Medication Sig Dispense Auth. Provider   miconazole (MICATIN) 2 % cream Apply 1 application topically 2 (two) times daily. 28.35 g Tyliek Timberman V, PA-C   cephALEXin (KEFLEX) 500 MG capsule Take 1 capsule (500 mg total) by mouth 4 (four) times daily. 28 capsule Threasa AlphaYu, Lache Dagher V, PA-C        Blandon Offerdahl V, [place]PA-C 07/05/18 1104

## 2018-07-05 NOTE — Discharge Instructions (Signed)
Start keflex as directed. Warm compress daily. Avoid picking/squeezing the area. Monitor for spreading redness, warmth, fever, swelling, follow up for reevaluation needed.  Miconazole for the corner of your mouth. Avoid licking/picking area. Monitor for worsening symptoms.

## 2018-07-05 NOTE — Discharge Instructions (Signed)
It was my pleasure taking care of you today!   Quit taking the Keflex and start taking Clindamycin instead.  Start taking Acyclovir as well.   Follow up with your primary care doctor. If you do not have one, please see the information below.   Return to ER if your symptoms are getting worse instead of better, you get a fever, new or worsening symptoms developed or any additional concerns.   To find a primary care or specialty doctor please call [phone] or [phone] to access "[place] Find a Doctor Service."  You may also go on the [facility] website at [email or web address]  There are also multiple [facility], [place] and [facility] practices throughout the [place] that are frequently accepting new patients. You may find a clinic that is close to your home and contact them.  [facility] - [address] [place] [address] [phone]  [facility] in [place] (also locations in [place] and [place]) - [address] [employer] [place] [phone]  [facility] - [address] [place] [phone]

## 2018-07-05 NOTE — ED Triage Notes (Signed)
Pt states she noticed a few days ago a bite on her left cheek that's been getting worse, unknown if she was bitten by something or not.

## 2018-07-05 NOTE — ED Notes (Signed)
HSV Culture and Typing collected by PA.

## 2018-07-06 NOTE — ED Notes (Signed)
Discharge instructions and medication reviewed with patient. Pt has no question at this time.

## 2018-07-08 LAB — HSV CULTURE AND TYPING
